# Patient Record
Sex: Female | Born: 1970 | Race: White | Hispanic: No | Marital: Single | Smoking: Never smoker
Health system: Southern US, Community
[De-identification: ages and names within clinical notes are randomized; demographics above are authoritative.]

## PROBLEM LIST (undated history)

## (undated) HISTORY — PX: TUBAL LIGATION: SHX77

---

## 2020-04-22 ENCOUNTER — Emergency Department (HOSPITAL_BASED_OUTPATIENT_CLINIC_OR_DEPARTMENT_OTHER)
Admission: EM | Admit: 2020-04-22 | Discharge: 2020-04-22 | Disposition: A | Discharge status: Home / Self Care | Payer: BC Managed Care – PPO | Attending: Emergency Medicine | Admitting: Emergency Medicine

## 2020-04-22 ENCOUNTER — Emergency Department (HOSPITAL_BASED_OUTPATIENT_CLINIC_OR_DEPARTMENT_OTHER): Payer: BC Managed Care – PPO

## 2020-04-22 ENCOUNTER — Encounter (HOSPITAL_BASED_OUTPATIENT_CLINIC_OR_DEPARTMENT_OTHER): Payer: Self-pay | Admitting: Emergency Medicine

## 2020-04-22 ENCOUNTER — Other Ambulatory Visit: Payer: Self-pay

## 2020-04-22 DIAGNOSIS — M79672 Pain in left foot: Secondary | ICD-10-CM | POA: Insufficient documentation

## 2020-04-22 DIAGNOSIS — N898 Other specified noninflammatory disorders of vagina: Secondary | ICD-10-CM | POA: Diagnosis not present

## 2020-04-22 DIAGNOSIS — R300 Dysuria: Secondary | ICD-10-CM | POA: Diagnosis present

## 2020-04-22 LAB — URINALYSIS, ROUTINE W REFLEX MICROSCOPIC
Bilirubin Urine: NEGATIVE
Glucose, UA: NEGATIVE mg/dL
Ketones, ur: NEGATIVE mg/dL
Nitrite: NEGATIVE
Protein, ur: NEGATIVE mg/dL
Specific Gravity, Urine: 1.025 (ref 1.005–1.030)
pH: 6 (ref 5.0–8.0)

## 2020-04-22 LAB — URINALYSIS, MICROSCOPIC (REFLEX): WBC, UA: >50 WBC/hpf (ref 0–5)

## 2020-04-22 LAB — WET PREP, GENITAL
Clue Cells Wet Prep HPF POC: NONE SEEN
Sperm: NONE SEEN
Trich, Wet Prep: NONE SEEN
Yeast Wet Prep HPF POC: NONE SEEN

## 2020-04-22 LAB — PREGNANCY, URINE: Preg Test, Ur: NEGATIVE

## 2020-04-22 MED ORDER — CEFTRIAXONE SODIUM 500 MG IJ SOLR
500.0000 mg | Freq: Once | INTRAMUSCULAR | Status: AC
  Administered 2020-04-22: 500 mg via INTRAMUSCULAR
  Filled 2020-04-22: qty 500

## 2020-04-22 MED ORDER — CEPHALEXIN 250 MG PO CAPS: 500.0000 mg | ORAL_CAPSULE | Freq: Once | ORAL | Status: DC

## 2020-04-22 MED ORDER — DOXYCYCLINE HYCLATE 100 MG PO CAPS
100.0000 mg | ORAL_CAPSULE | Freq: Two times a day (BID) | ORAL | 0 refills | Status: AC
Start: 2020-04-22 — End: 2020-04-29

## 2020-04-22 MED ORDER — LIDOCAINE HCL (PF) 1 % IJ SOLN
INTRAMUSCULAR | Status: AC
  Filled 2020-04-22: qty 5

## 2020-04-22 MED ORDER — DOXYCYCLINE HYCLATE 100 MG PO TABS
100.0000 mg | ORAL_TABLET | Freq: Once | ORAL | Status: AC
  Administered 2020-04-22: 100 mg via ORAL
  Filled 2020-04-22: qty 1

## 2020-04-22 MED ORDER — CEPHALEXIN 500 MG PO CAPS
500.0000 mg | ORAL_CAPSULE | Freq: Two times a day (BID) | ORAL | 0 refills | Status: AC
Start: 2020-04-22 — End: 2020-04-29

## 2020-04-22 MED ORDER — LIDOCAINE HCL (PF) 1 % IJ SOLN
2.0000 mL | Freq: Once | INTRAMUSCULAR | Status: AC
  Administered 2020-04-22: 2 mL

## 2020-04-22 MED ORDER — ONDANSETRON 4 MG PO TBDP
8.0000 mg | ORAL_TABLET | Freq: Once | ORAL | Status: AC
  Administered 2020-04-22: 8 mg via ORAL
  Filled 2020-04-22: qty 2

## 2020-04-22 NOTE — ED Notes (Signed)
No s/s of reaction noted to rocephin injection.  Discharge papers reviewed.  Left at this time.

## 2020-04-22 NOTE — ED Provider Notes (Signed)
[facility] EMERGENCY DEPARTMENT Provider Note   CSN: [id number] Arrival date & time: 04/22/20  1659     History Chief Complaint  Patient presents with  . Dysuria  . Foot Pain  . Vaginal Discharge    Olivia Hebert is a 49 y.o. female with past medical history significant for UTIs. She is here on vacation from [place].  HPI Patient is presenting today with chief complaints of dysuria, vaginal discharge, left foot pain.  She states the dysuria and vaginal discharge been present x3 days.  She describes dysuria as burning with urination.  She states the vaginal discharge is thin white in color and is malodorous.  She is sexually active with 1 female partner without protection.  She denies history of STIs.   Patient states a very long time ago she stepped on a piece of glass from a broken night light and it was embedded in sole of left foot.  The laceration was repaired however some of the glass was not able to be completely removed.  She states she told her PCP about this and was recommended she have an x-ray in the future to look for foreign body.  Patient is reporting that she has an aching sensation on the sole of left foot.  The pain is worse with any ambulation.  She denies any new fall or injury.  Has not had any medications for symptoms prior to arrival.  She thinks the pain is the same spot where the glass is located.  Patient denies any fever, chills, abdominal pain, nausea, emesis, abnormal vaginal bleeding, numbness, weakness, decrease sensation in left foot.    History reviewed. No pertinent past medical history.  There are no problems to display for this patient.   Past Surgical History:  Procedure Laterality Date  . TUBAL LIGATION       OB History   No obstetric history on file.     No family history on file.  Social History   Tobacco Use  . Smoking status: Never Smoker  . Smokeless tobacco: Never Used  Vaping Use  . Vaping Use: Never used  Substance Use  Topics  . Alcohol use: Never  . Drug use: Never    Home Medications Prior to Admission medications   Medication Sig Start Date End Date Taking? Authorizing Provider  cephALEXin (KEFLEX) 500 MG capsule Take 1 capsule (500 mg total) by mouth 2 (two) times daily for 7 days. 04/22/20 04/29/20  Amayah Staheli E, PA-C  doxycycline (VIBRAMYCIN) 100 MG capsule Take 1 capsule (100 mg total) by mouth 2 (two) times daily for 7 days. 04/22/20 04/29/20  Jazzelle Zhang, Caroleen Hamman, PA-C    Allergies    Patient has no known allergies.  Review of Systems   Review of Systems  All other systems are reviewed and are negative for acute change except as noted in the HPI.   Physical Exam Updated Vital Signs BP (!) 108/54 (BP Location: Left Arm)   Pulse 66   Temp 98 F (36.7 C) (Oral)   Resp 18   Ht 5\' 1"  (1.549 m)   Wt 86.2 kg   LMP 02/28/2020   SpO2 97%   BMI 35.90 kg/m   Physical Exam Vitals and nursing note reviewed.  Constitutional:      General: She is not in acute distress.    Appearance: She is not ill-appearing.  HENT:     Head: Normocephalic and atraumatic.     Right Ear: Tympanic membrane and external  ear normal.     Left Ear: Tympanic membrane and external ear normal.     Nose: Nose normal.     Mouth/Throat:     Mouth: Mucous membranes are moist.     Pharynx: Oropharynx is clear.  Eyes:     General: No scleral icterus.       Right eye: No discharge.        Left eye: No discharge.     Extraocular Movements: Extraocular movements intact.     Conjunctiva/sclera: Conjunctivae normal.     Pupils: Pupils are equal, round, and reactive to light.  Neck:     Vascular: No JVD.  Cardiovascular:     Rate and Rhythm: Normal rate and regular rhythm.     Pulses: Normal pulses.          Radial pulses are 2+ on the right side and 2+ on the left side.     Heart sounds: Normal heart sounds.  Pulmonary:     Comments: Lungs clear to auscultation in all fields. Symmetric chest rise. No wheezing,  rales, or rhonchi. Abdominal:     Comments: Abdomen is soft, non-distended.  Mild tenderness to palpation of suprapubic area. No rigidity, no guarding. No peritoneal signs.  Genitourinary:    Comments: Normal external genitalia. No pain with speculum insertion. Closed cervical os with normal appearance - no rash or lesions. No significant bleeding noted from cervix or in vaginal vault.  There is thin white discharge seen in vaginal vault. On bimanual examination no adnexal tenderness or cervical motion tenderness. Chaperone Susanna RN present during exam.  Musculoskeletal:        General: Normal range of motion.     Cervical back: Normal range of motion.       Feet:     Comments: Full range of motion of left ankle.  Feet:     Comments: Tenderness to palpation as depicted in image above.  No overlying skin changes.  No foreign body appreciated on palpation. Skin:    General: Skin is warm and dry.     Capillary Refill: Capillary refill takes less than 2 seconds.  Neurological:     Mental Status: She is oriented to person, place, and time.     GCS: GCS eye subscore is 4. GCS verbal subscore is 5. GCS motor subscore is 6.     Comments: Fluent speech, no facial droop.  Psychiatric:        Behavior: Behavior normal.     ED Results / Procedures / Treatments   Labs (all labs ordered are listed, but only abnormal results are displayed) Labs Reviewed  WET PREP, GENITAL - Abnormal; Notable for the following components:      Result Value   WBC, Wet Prep HPF POC FEW (*)    All other components within normal limits  URINALYSIS, ROUTINE W REFLEX MICROSCOPIC - Abnormal; Notable for the following components:   APPearance CLOUDY (*)    Hgb urine dipstick SMALL (*)    Leukocytes,Ua MODERATE (*)    All other components within normal limits  URINALYSIS, MICROSCOPIC (REFLEX) - Abnormal; Notable for the following components:   Bacteria, UA MANY (*)    All other components within normal limits    URINE CULTURE  PREGNANCY, URINE  RPR  HIV ANTIBODY (ROUTINE TESTING W REFLEX)  GC/CHLAMYDIA PROBE AMP ([place]) NOT AT [facility]    EKG None  Radiology DG Foot Complete Left  Result Date: 04/22/2020 CLINICAL DATA:  Left foot  pain. Patient reports stepping on glass 10 years ago which was never removed. Pain at base of second and third toes for 1 week. EXAM: LEFT FOOT - COMPLETE 3+ VIEW COMPARISON:  None. FINDINGS: There is no evidence of fracture or dislocation. There is no evidence of arthropathy or other focal bone abnormality. Tiny plantar calcaneal spur. Minimal talonavicular spurring. No erosion, periosteal reaction, or bone destruction. There are no radiopaque foreign bodies. No soft tissue air. IMPRESSION: 1. Tiny plantar calcaneal spur. Minimal talonavicular spurring. 2. No radiopaque foreign body. Electronically Signed   By: Keith Rake M.D.   On: 04/22/2020 18:04    Procedures Procedures (including critical care time)  Medications Ordered in ED Medications  cefTRIAXone (ROCEPHIN) injection 500 mg (has no administration in time range)  ondansetron (ZOFRAN-ODT) disintegrating tablet 8 mg (has no administration in time range)  doxycycline (VIBRA-TABS) tablet 100 mg (has no administration in time range)    ED Course  I have reviewed the triage vital signs and the nursing notes.  Pertinent labs & imaging results that were available during my care of the patient were reviewed by me and considered in my medical decision making (see chart for details).    MDM Rules/Calculators/A&P                          History provided by patient with additional history obtained from chart review.    Patient seen and examined. Patient presents awake, alert, hemodynamically stable, afebrile, non toxic.  On exam she has tenderness palpation of suprapubic area without peritoneal signs.  No CVA tenderness or flank pain.  Left foot has no obvious deformity.  There is no foreign body palpated  on exam.  She is able to walk and bear weight without any difficulty.  X-ray shows no evidence of foreign body  UA was collected and is suggestive of infection with moderate leukocytes and over 50 WBC.  Urine culture sent.  Pelvic exam performed and there was thin white discharge seen in vaginal vault.  She had no cervical motion tenderness or adnexal tenderness, exam not consistent with PID.  Wet prep only shows few WBC, no clue cells, trichomoniasis or yeast.  Will treat patient prophylactically for GC with IM Rocephin and prescription for doxycycline following CDC guidelines.  Will discharge patient with Keflex for UTI.  Recommend patient follow-up with PCP when she returns home.  The patient appears reasonably screened and/or stabilized for discharge and I doubt any other medical condition or other [facility] requiring further screening, evaluation, or treatment in the ED at this time prior to discharge. The patient is safe for discharge with strict return precautions discussed.  Portions of this note were generated with [employer]. Dictation errors may occur despite best attempts at proofreading.     Final Clinical Impression(s) / ED Diagnoses Final diagnoses:  Dysuria  Foot pain, left    Rx / DC Orders ED Discharge Orders         Ordered    cephALEXin (KEFLEX) 500 MG capsule  2 times daily     Discontinue  Reprint     04/22/20 1854    doxycycline (VIBRAMYCIN) 100 MG capsule  2 times daily     Discontinue  Reprint     04/22/20 1854           Cherre Robins, PA-C 04/22/20 1901    Blanchie Dessert, MD 04/23/20 [phone]

## 2020-04-22 NOTE — ED Triage Notes (Addendum)
Pt c/o pelvic pain x couple days with urge to urinate.  Pt also reports left foot pain x 1 week.

## 2020-04-22 NOTE — Discharge Instructions (Addendum)
Return to the emergency department for any new or worsening symptoms.  Follow-up with your primary care doctor when you get home to discuss the pain in your foot.  Your urine sample shows infection. A prescription was printed for keflex. This is used to treat UTI. Start taking this tomorrow.  You were given prescription for doxycycline incase your STD testing is positive. Take this medicine until you know the test results. If positive your partner will need to be treated.   ____________________________________________________   The xray of your foot today shows: EXAM:  LEFT FOOT - COMPLETE 3+ VIEW     COMPARISON:  None.     FINDINGS:  There is no evidence of fracture or dislocation. There is no  evidence of arthropathy or other focal bone abnormality. Tiny  plantar calcaneal spur. Minimal talonavicular spurring. No erosion,  periosteal reaction, or bone destruction. There are no radiopaque  foreign bodies. No soft tissue air.     IMPRESSION:  1. Tiny plantar calcaneal spur. Minimal talonavicular spurring.  2. No radiopaque foreign body.

## 2020-04-23 LAB — RPR: RPR Ser Ql: NONREACTIVE

## 2020-04-23 LAB — HIV ANTIBODY (ROUTINE TESTING W REFLEX): HIV Screen 4th Generation wRfx: NONREACTIVE

## 2020-04-24 LAB — GC/CHLAMYDIA PROBE AMP (~~LOC~~) NOT AT ARMC
Chlamydia: NEGATIVE
Comment: NEGATIVE
Comment: NORMAL
Neisseria Gonorrhea: NEGATIVE

## 2020-04-25 LAB — URINE CULTURE: Culture: >=100000 — AB

## 2020-04-26 ENCOUNTER — Telehealth: Payer: Self-pay | Admitting: *Deleted

## 2020-04-26 NOTE — Telephone Encounter (Signed)
Post ED Visit - Positive Culture Follow-up  Culture report reviewed by antimicrobial stewardship pharmacist: [facility] []  , Pharm.D. []  Enzo Bi, Pharm.D., BCPS AQ-ID []  , Pharm.D., BCPS []  Celedonio Miyamoto, Pharm.D., BCPS []  [place], Garvin Fila.D., BCPS, AAHIVP []  , Pharm.D., BCPS, AAHIVP []  Georgina Pillion, PharmD, BCPS []  , PharmD, BCPS []  [place], PharmD, BCPS []  [place], PharmD [x]  , PharmD, BCPS []  Estella Husk, PharmD  Pharmacy Team []  Lysle Pearl, PharmD []  , PharmD []  Phillips Climes, PharmD []  , Rph []  Agapito Games) , PharmD []  Verlan Friends, PharmD []  , PharmD []  Mervyn Gay, PharmD []  , PharmD []  Vinnie Level, PharmD []  [facility], PharmD []  , PharmD []  Len Childs, PharmD   Positive urine culture Treated with Cephalexin, organism sensitive to the same and no further patient follow-up is required at this time.  [facility] 04/26/2020, 12:58 PM

## 2021-04-03 IMAGING — DX DG FOOT COMPLETE 3+V*L*
3 series · 3 of 3 positions shown · non-contrast
Comparison: None.

CLINICAL DATA: Left foot pain. Patient reports stepping on glass 10
years ago which was never removed. Pain at base of second and third
toes for 1 week.

EXAM:
LEFT FOOT - COMPLETE 3+ VIEW

[foot ap]
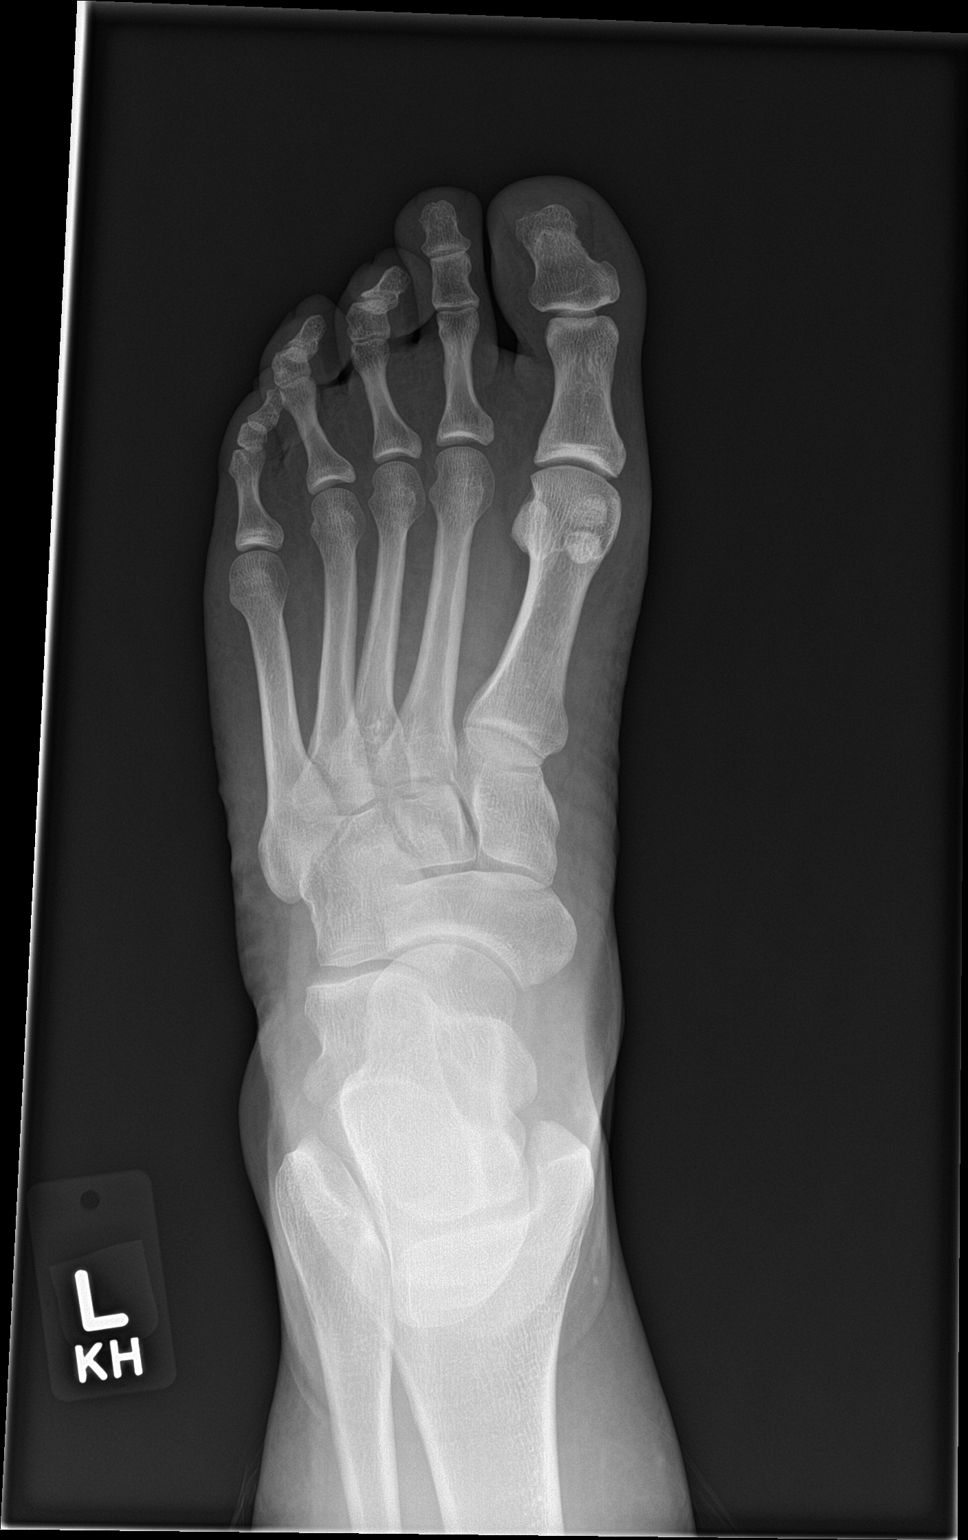

[foot obl]
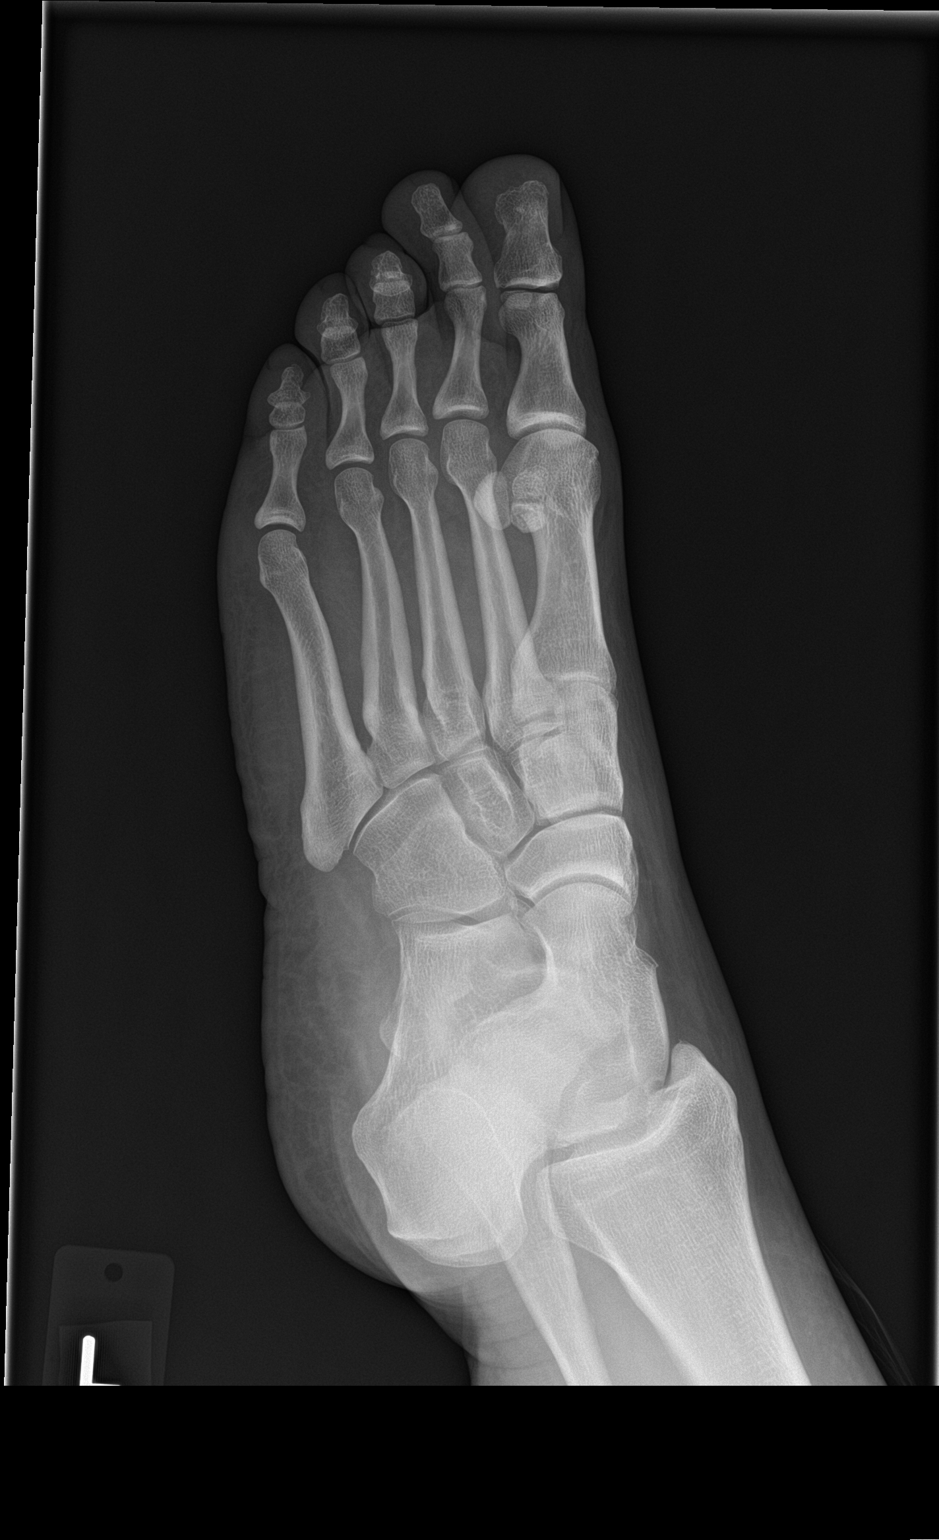

[foot lat]
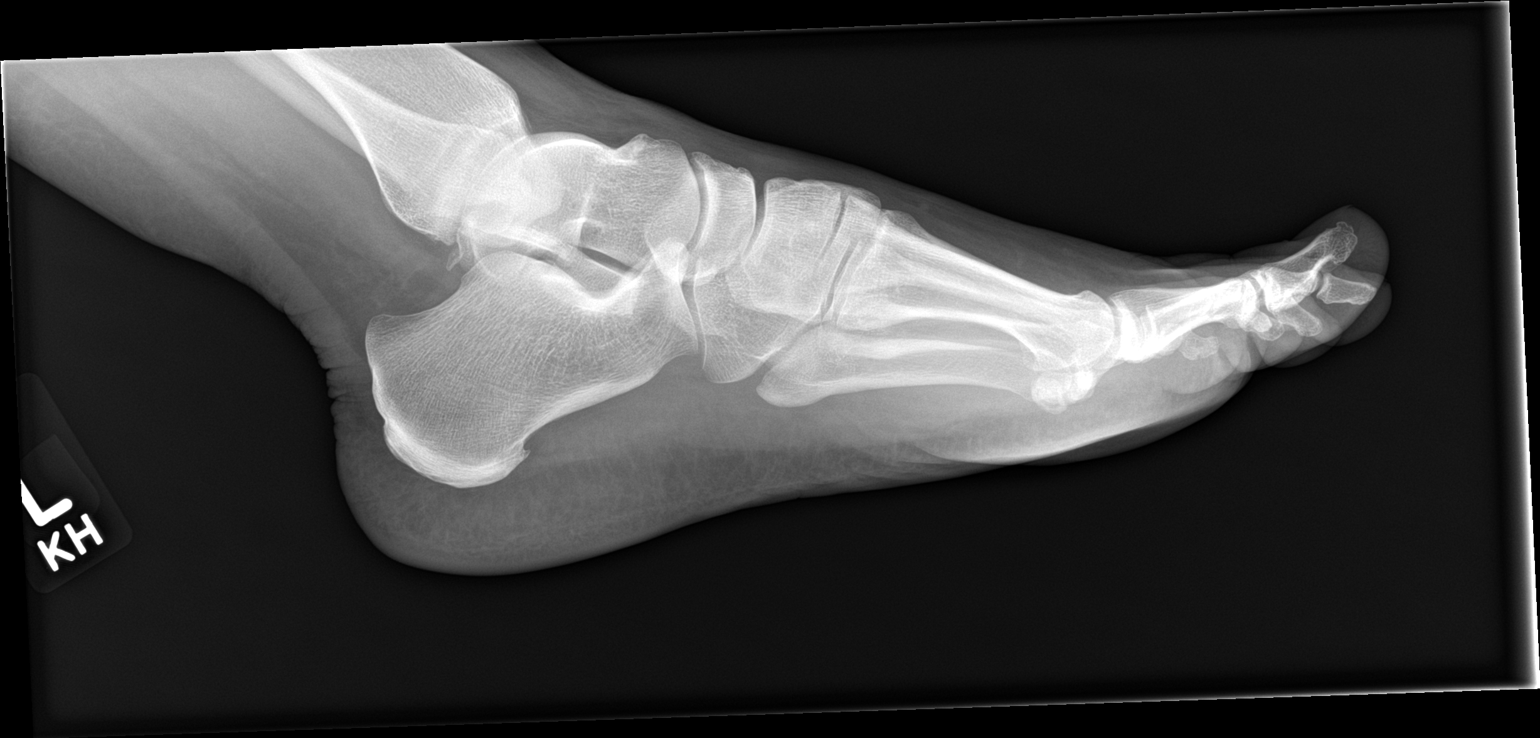

[3 of 3 positions shown; findings below may reference images not displayed]

FINDINGS: There is no evidence of fracture or dislocation. There is no
evidence of arthropathy or other focal bone abnormality. Tiny
plantar calcaneal spur. Minimal talonavicular spurring. No erosion,
periosteal reaction, or bone destruction. There are no radiopaque
foreign bodies. No soft tissue air.
IMPRESSION: 1. Tiny plantar calcaneal spur. Minimal talonavicular spurring.
2. No radiopaque foreign body.
